# Patient Record
Sex: Female | Born: 1970 | Race: White | Hispanic: No | Marital: Married | Smoking: Current every day smoker
Health system: Southern US, Community
[De-identification: ages and names within clinical notes are randomized; demographics above are authoritative.]

---

## 2003-06-24 ENCOUNTER — Emergency Department (HOSPITAL_COMMUNITY): Admission: EM | Admit: 2003-06-24 | Discharge: 2003-06-24 | Payer: Self-pay | Admitting: Family Medicine

## 2003-08-30 ENCOUNTER — Emergency Department (HOSPITAL_COMMUNITY): Admission: EM | Admit: 2003-08-30 | Discharge: 2003-08-30 | Payer: Self-pay | Admitting: Emergency Medicine

## 2003-09-08 ENCOUNTER — Emergency Department (HOSPITAL_COMMUNITY): Admission: EM | Admit: 2003-09-08 | Discharge: 2003-09-08 | Payer: Self-pay | Admitting: Emergency Medicine

## 2004-12-08 ENCOUNTER — Inpatient Hospital Stay (HOSPITAL_COMMUNITY): Admission: AD | Admit: 2004-12-08 | Discharge: 2004-12-08 | Payer: Self-pay | Admitting: Obstetrics and Gynecology

## 2005-05-13 ENCOUNTER — Ambulatory Visit (HOSPITAL_COMMUNITY): Admission: RE | Admit: 2005-05-13 | Discharge: 2005-05-13 | Payer: Self-pay | Admitting: *Deleted

## 2005-07-20 ENCOUNTER — Ambulatory Visit (HOSPITAL_COMMUNITY): Admission: RE | Admit: 2005-07-20 | Discharge: 2005-07-20 | Payer: Self-pay | Admitting: Obstetrics and Gynecology

## 2005-09-05 ENCOUNTER — Ambulatory Visit: Payer: Self-pay | Admitting: Certified Nurse Midwife

## 2005-09-05 ENCOUNTER — Inpatient Hospital Stay (HOSPITAL_COMMUNITY): Admission: AD | Admit: 2005-09-05 | Discharge: 2005-09-05 | Payer: Self-pay | Admitting: Obstetrics & Gynecology

## 2005-09-06 ENCOUNTER — Ambulatory Visit (HOSPITAL_COMMUNITY): Admission: RE | Admit: 2005-09-06 | Discharge: 2005-09-06 | Payer: Self-pay | Admitting: Family Medicine

## 2005-09-08 ENCOUNTER — Ambulatory Visit: Payer: Self-pay | Admitting: Family Medicine

## 2005-09-21 ENCOUNTER — Ambulatory Visit (HOSPITAL_COMMUNITY): Admission: RE | Admit: 2005-09-21 | Discharge: 2005-09-21 | Payer: Self-pay | Admitting: Family Medicine

## 2005-09-22 ENCOUNTER — Ambulatory Visit: Payer: Self-pay | Admitting: Gynecology

## 2005-09-30 ENCOUNTER — Ambulatory Visit: Payer: Self-pay | Admitting: Obstetrics & Gynecology

## 2005-10-13 ENCOUNTER — Ambulatory Visit: Payer: Self-pay | Admitting: Gynecology

## 2005-10-27 ENCOUNTER — Ambulatory Visit: Payer: Self-pay | Admitting: Gynecology

## 2005-11-10 ENCOUNTER — Ambulatory Visit: Payer: Self-pay | Admitting: Gynecology

## 2005-11-14 ENCOUNTER — Ambulatory Visit: Payer: Self-pay | Admitting: Gynecology

## 2005-11-17 ENCOUNTER — Ambulatory Visit: Payer: Self-pay | Admitting: Gynecology

## 2005-11-24 ENCOUNTER — Ambulatory Visit: Payer: Self-pay | Admitting: Family Medicine

## 2005-11-24 ENCOUNTER — Ambulatory Visit: Admission: RE | Admit: 2005-11-24 | Discharge: 2005-11-24 | Payer: Self-pay | Admitting: Obstetrics and Gynecology

## 2005-11-28 ENCOUNTER — Ambulatory Visit: Payer: Self-pay | Admitting: Obstetrics & Gynecology

## 2005-12-01 ENCOUNTER — Ambulatory Visit: Payer: Self-pay | Admitting: Gynecology

## 2005-12-01 ENCOUNTER — Ambulatory Visit: Payer: Self-pay | Admitting: Obstetrics & Gynecology

## 2005-12-01 ENCOUNTER — Inpatient Hospital Stay (HOSPITAL_COMMUNITY): Admission: AD | Admit: 2005-12-01 | Discharge: 2005-12-04 | Payer: Self-pay | Admitting: Family Medicine

## 2005-12-06 ENCOUNTER — Inpatient Hospital Stay (HOSPITAL_COMMUNITY): Admission: AD | Admit: 2005-12-06 | Discharge: 2005-12-06 | Payer: Self-pay | Admitting: Obstetrics and Gynecology

## 2005-12-21 ENCOUNTER — Ambulatory Visit: Payer: Self-pay | Admitting: Obstetrics and Gynecology

## 2006-08-10 IMAGING — US US OB TRANSVAGINAL MODIFY
1 series · 18 of 28 positions shown · non-contrast
Comparison: none

CLINICAL DATA: 8 week 6 day gestational age by LMP.  Measuring large for dates. Evaluate gestational age and viability.  
 OBSTETRICAL ULTRASOUND <14 WKS AND TRANSVAGINAL OB US:
TECHNIQUE: Both transabdominal and transvaginal ultrasound examinations were performed for complete evaluation of the gestation as well as the maternal uterus, adnexal regions, and pelvic cul-de-sac.

[Series 1: us ob comp less 14 wks · 18 of 41 slices shown]
[im 1/41]
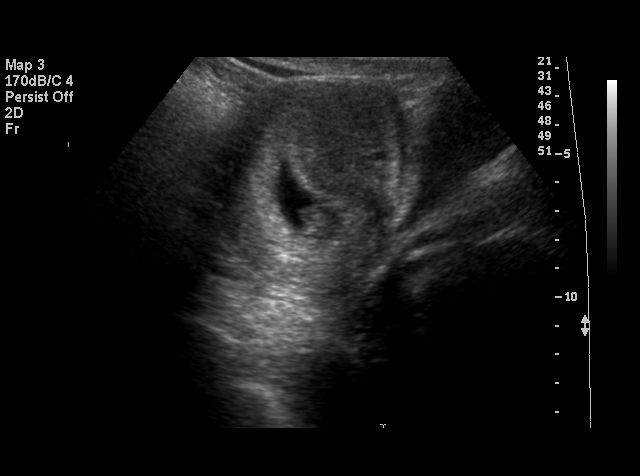
[im 3/41]
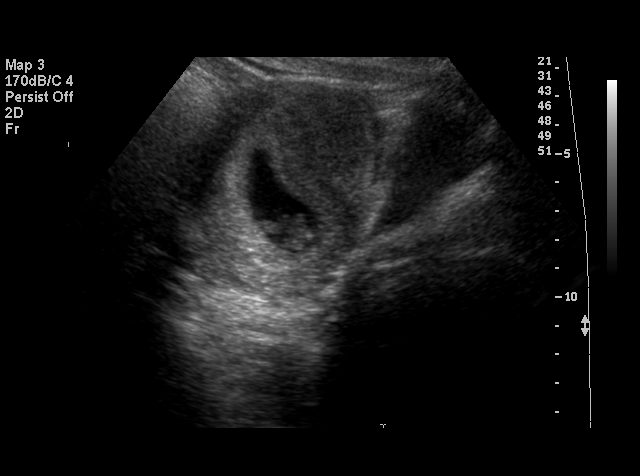
[im 5/41]
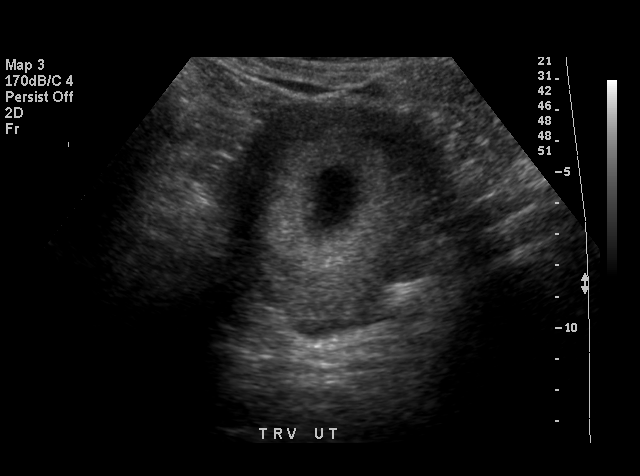
[im 8/41]
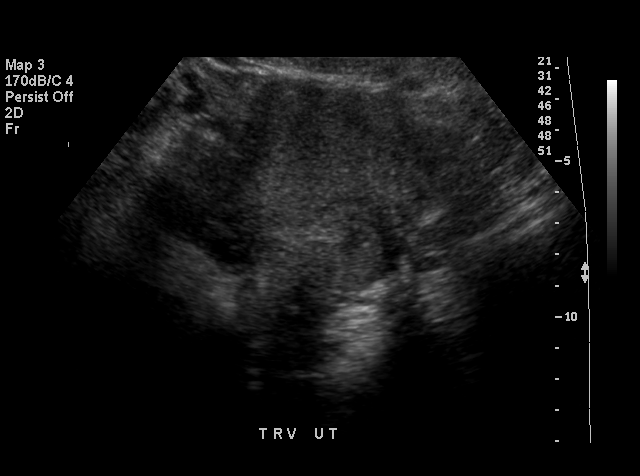
[im 11/41]
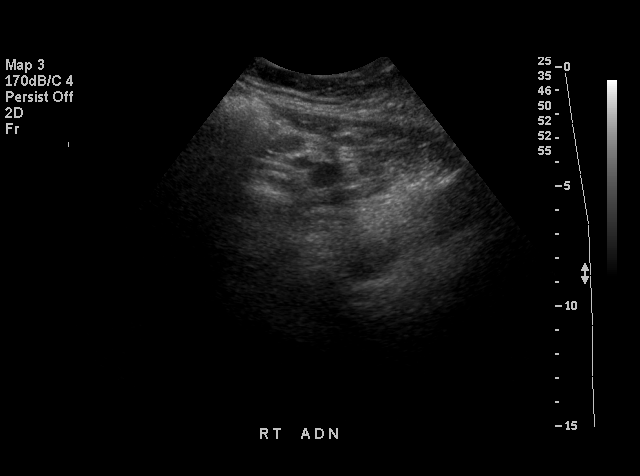
[im 12/41]
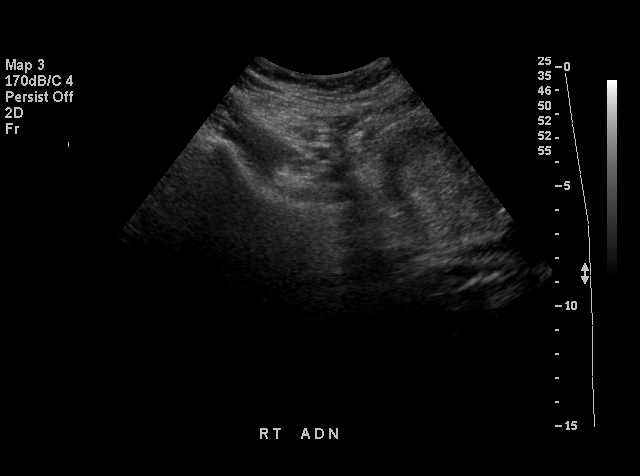
[im 15/41]
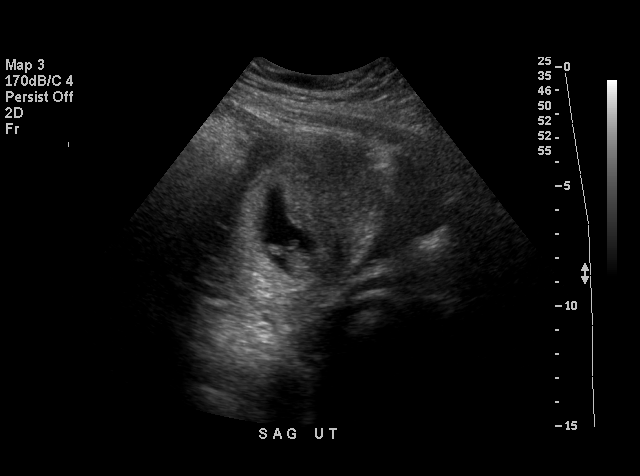
[im 17/41]
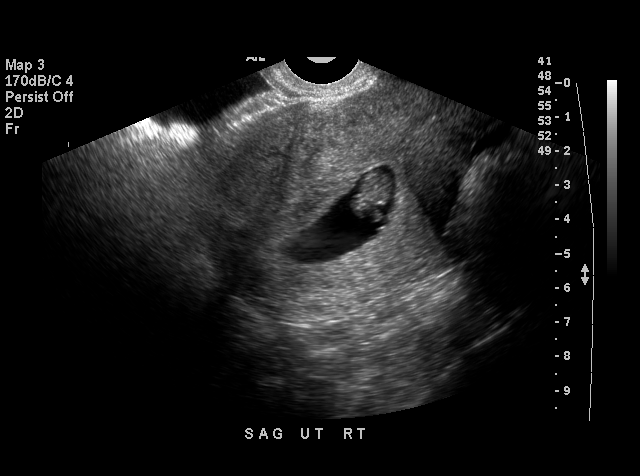
[im 20/41]
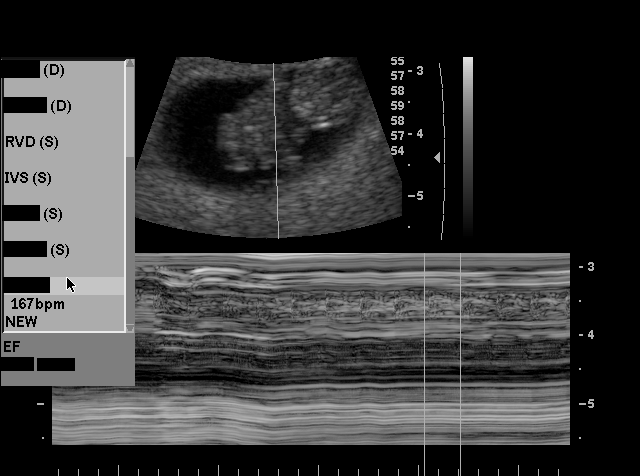
[im 21/41]
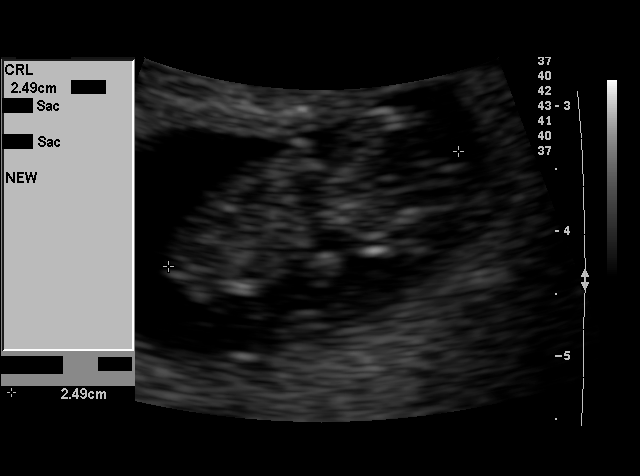
[im 24/41]
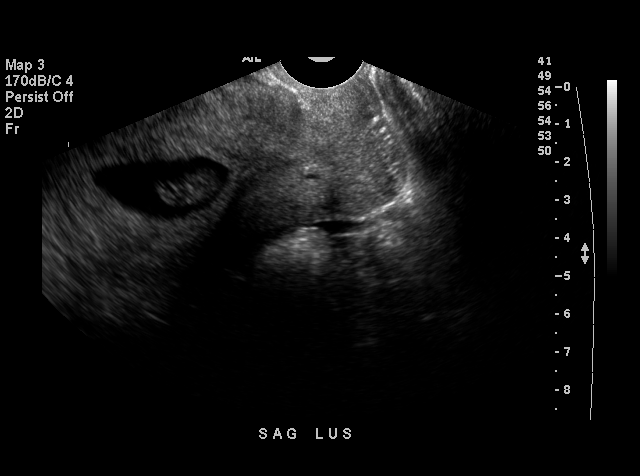
[im 26/41]
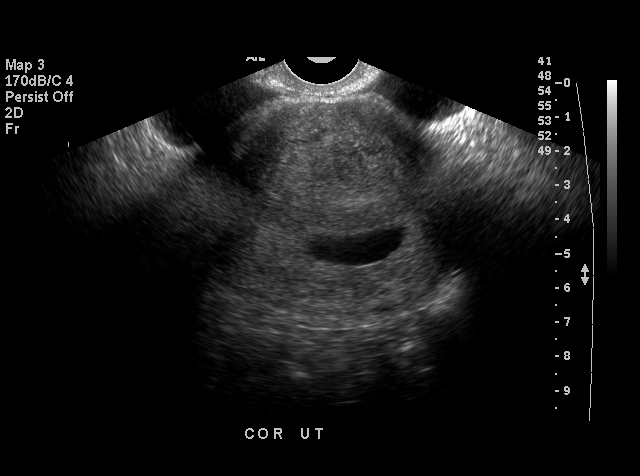
[im 29/41]
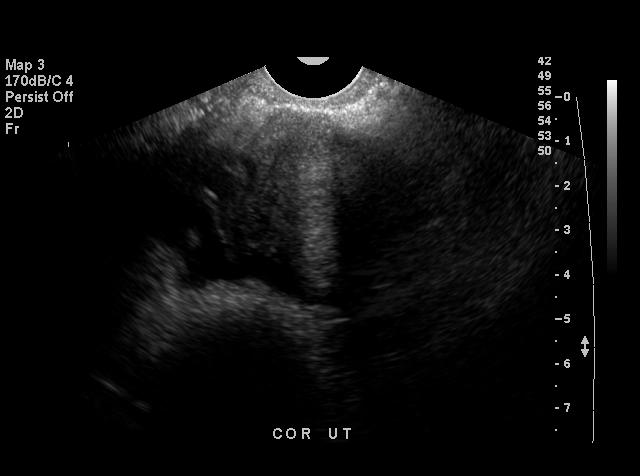
[im 32/41]
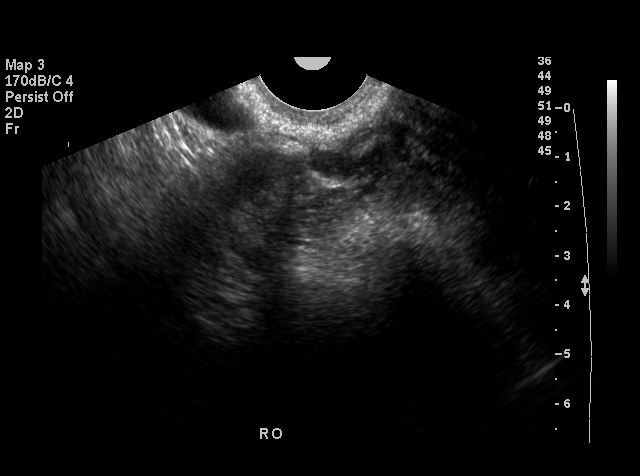
[im 33/41]
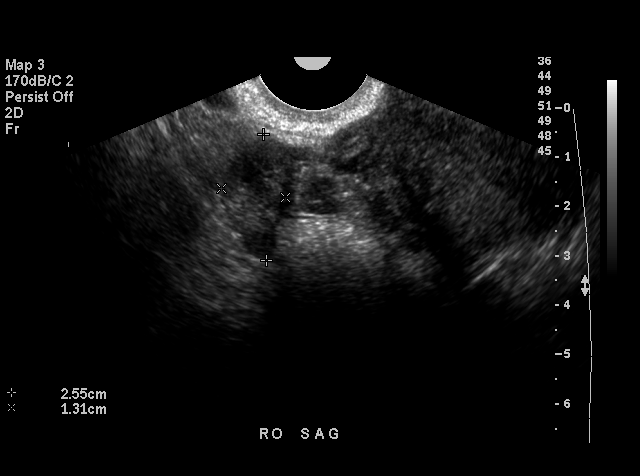
[im 36/41]
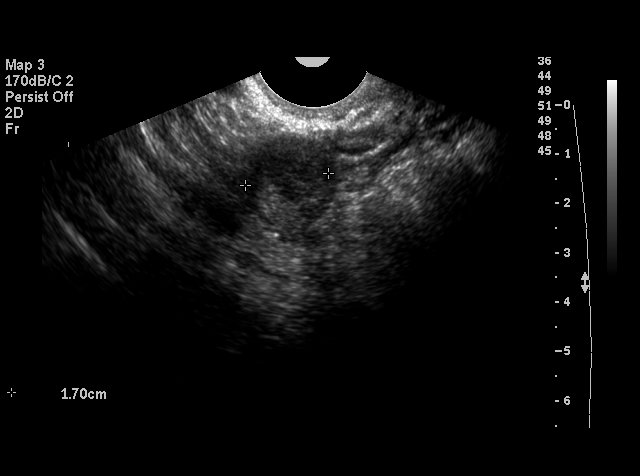
[im 38/41]
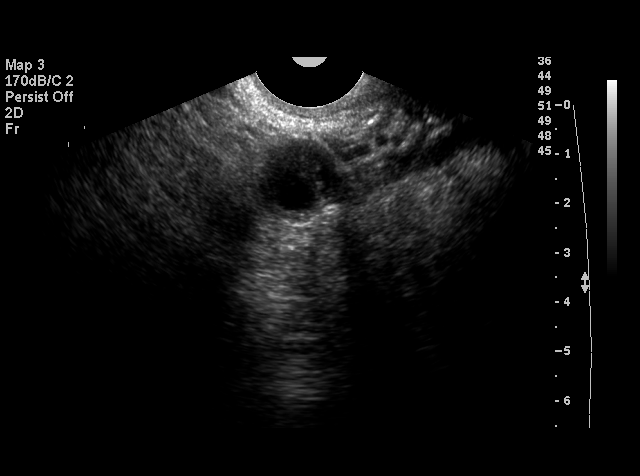
[im 41/41]
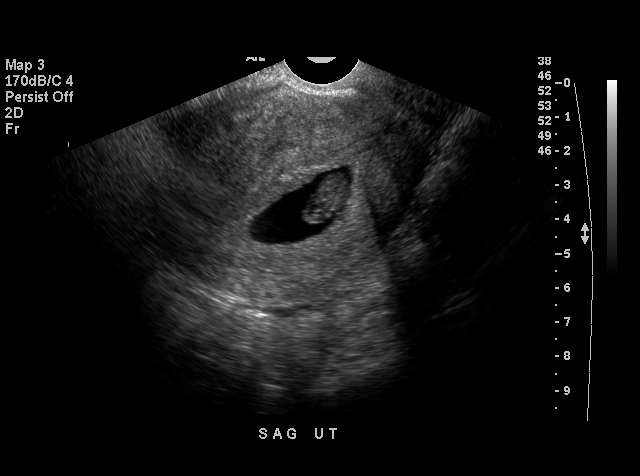

[18 of 28 positions shown; findings below may reference images not displayed]

FINDINGS: A single living intrauterine gestation is seen with measured heart rate of 167.  Embryonic crown rump length measures 2.4 cm, corresponding with a gestational age of 9 weeks 1 day.  A normal yolk sac is seen.  There is no evidence of subchorionic hemorrhage.  No fibroids or other uterine abnormalities are seen.
 Both ovaries are directly visualized on transvaginal sonography and are normal in appearance.  No adnexal masses or free fluid are identified by either transabdominal or transvaginal approach.
IMPRESSION: 1.  Single living intrauterine gestation with estimated gestational age of 9 weeks 1 day and sonographic EDC of 12/15/05.  This is concordant with LMP.
 2.  No maternal uterine or adnexal abnormality identified.

## 2006-10-17 IMAGING — US US OB COMP +14 WK
1 series · 13 of 28 positions shown · non-contrast
Comparison: 05/13/05.

CLINICAL DATA: Evaluate anatomy.  

 OBSTETRICAL ULTRASOUND:

[Series 1: us ob comp +14 wk · 0.33mm/px · 13 of 101 slices shown]
[im 4/101]
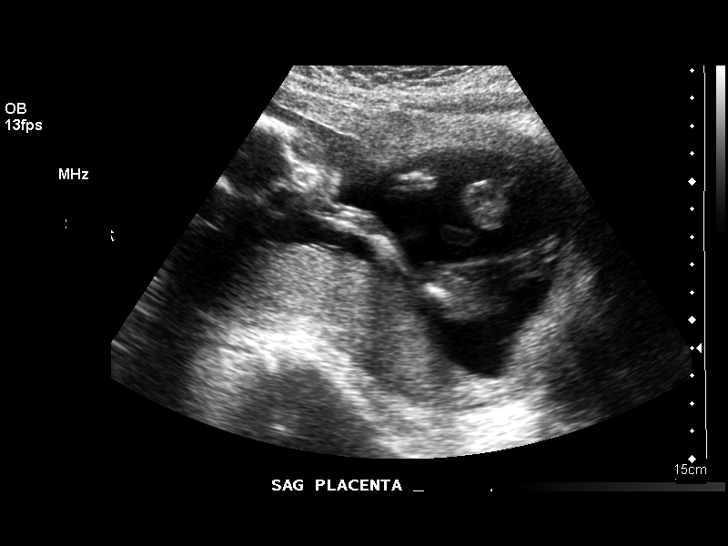
[im 12/101]
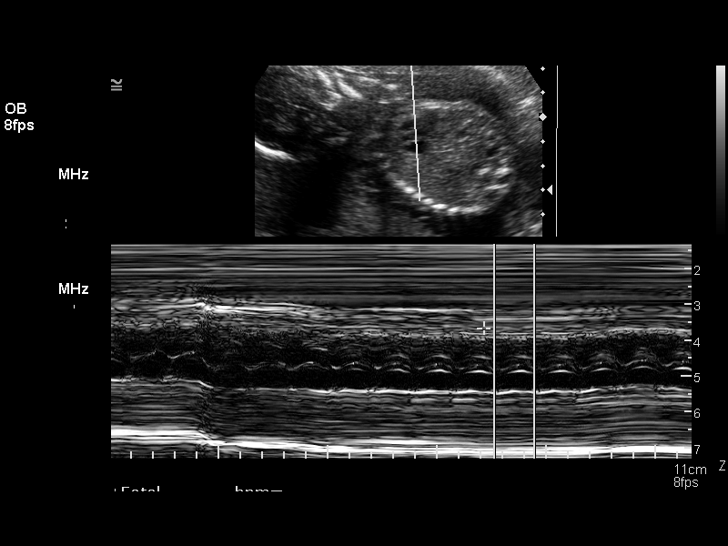
[im 19/101]
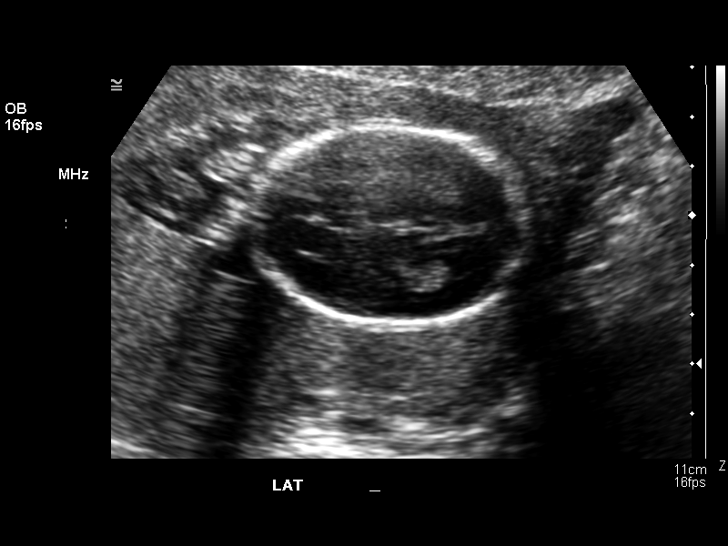
[im 26/101]
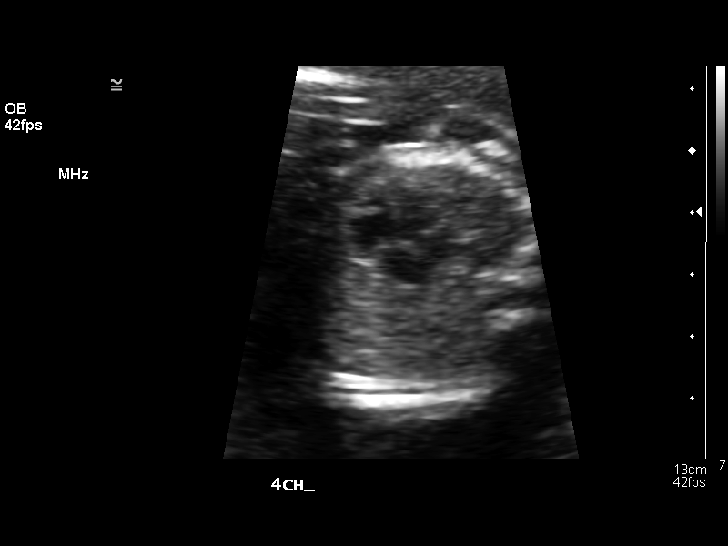
[im 34/101]
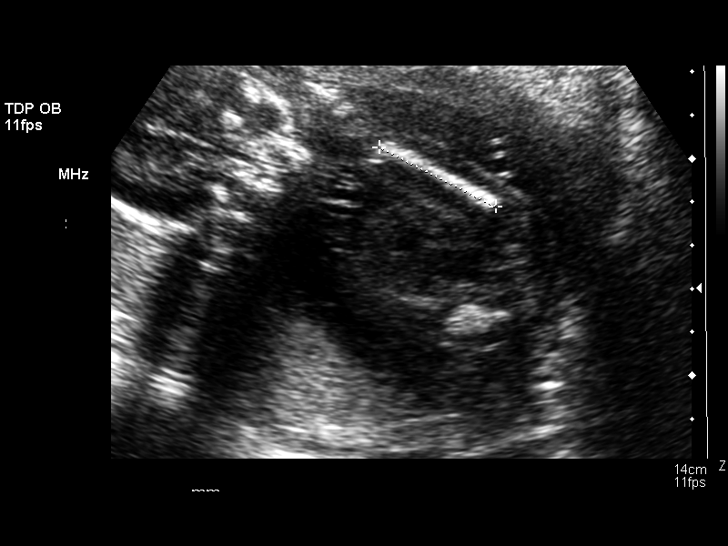
[im 41/101]
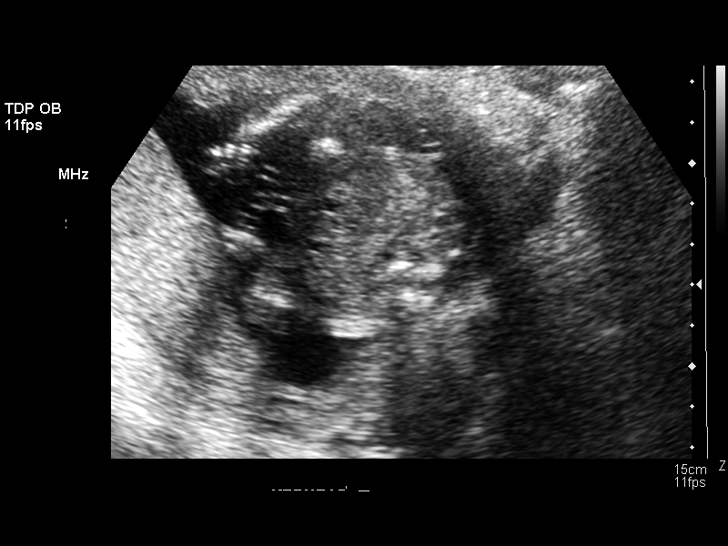
[im 52/101]
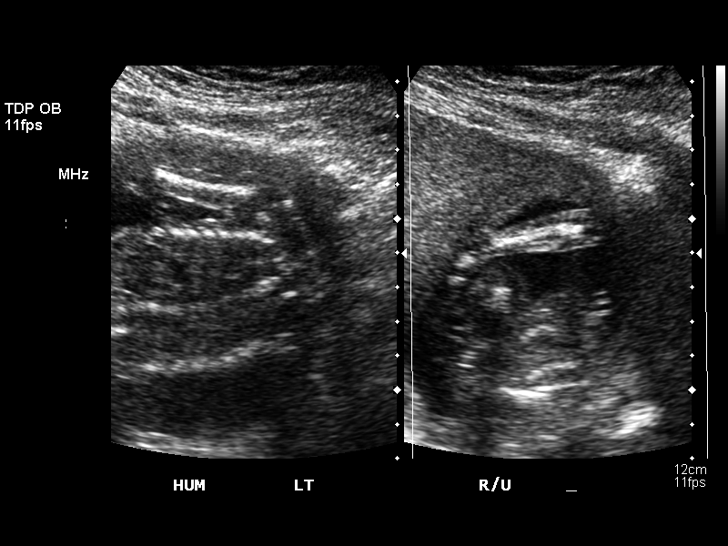
[im 60/101]
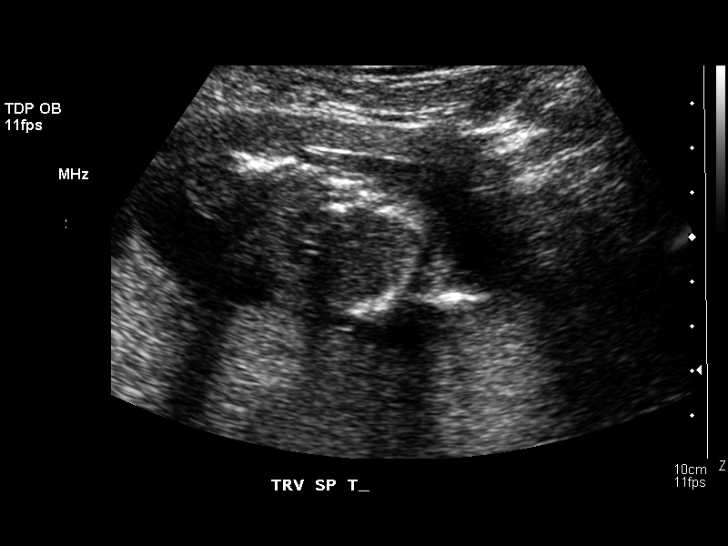
[im 67/101]
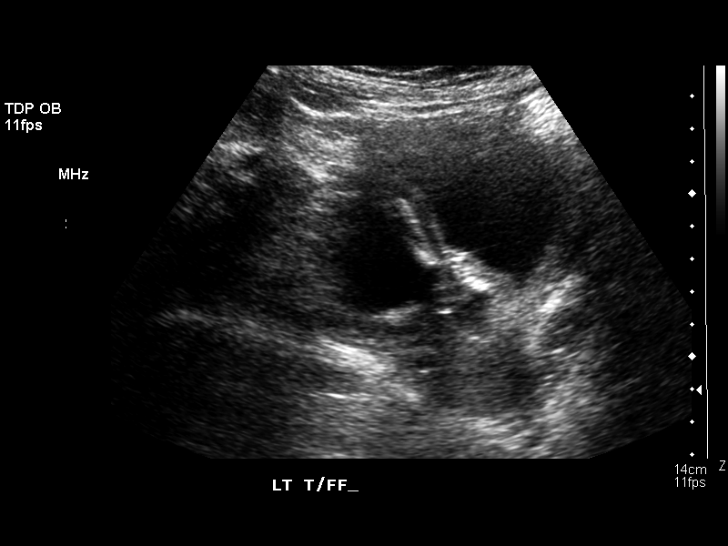
[im 75/101]
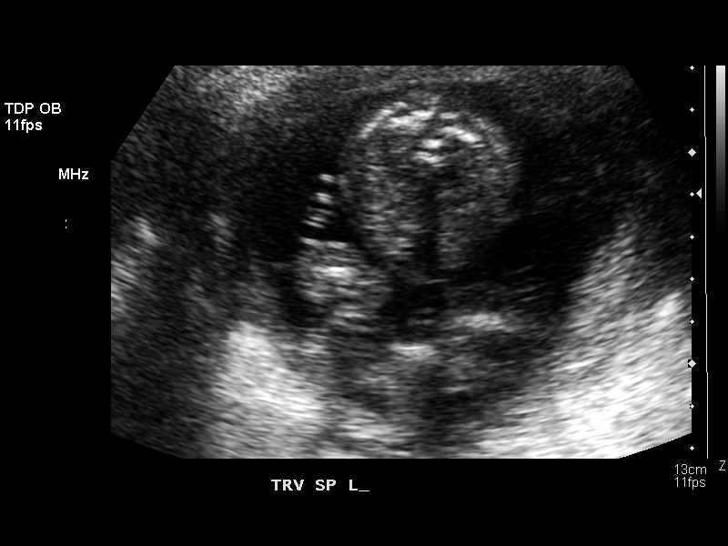
[im 82/101]
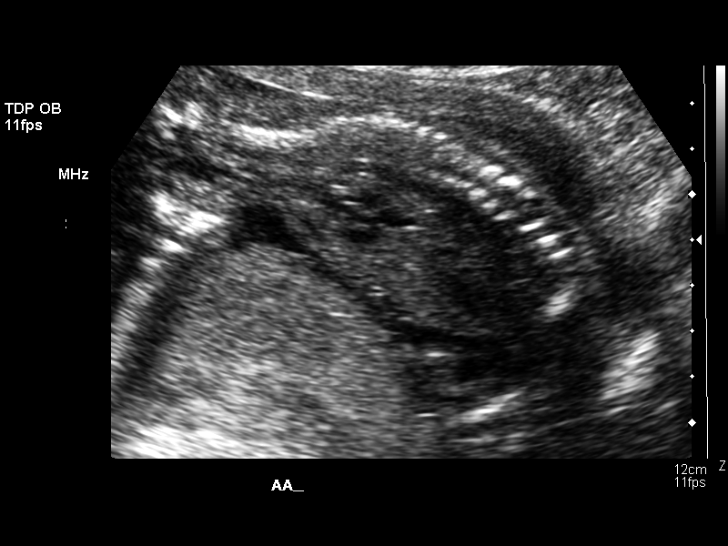
[im 89/101]
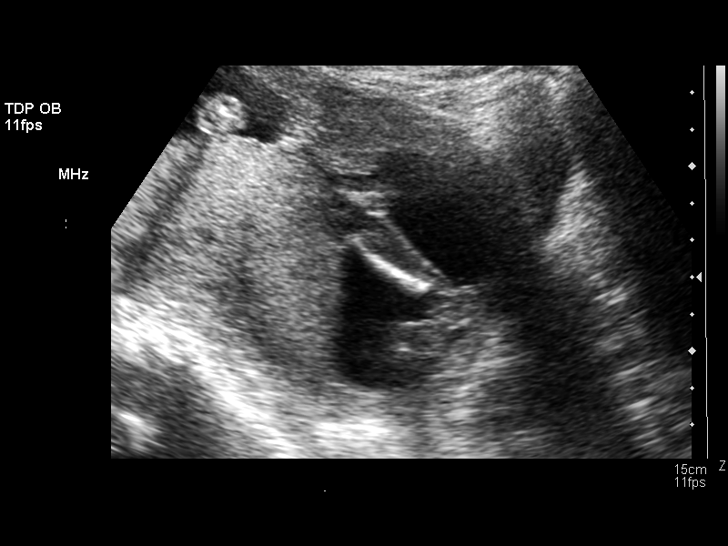
[im 97/101]
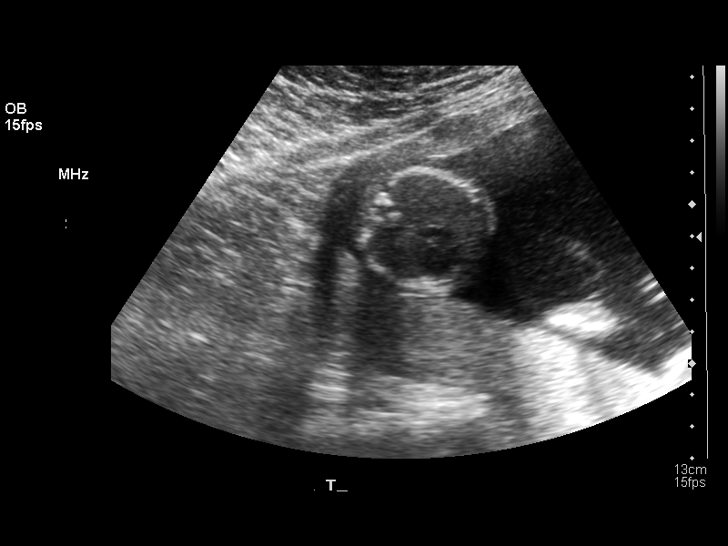

[13 of 28 positions shown; findings below may reference images not displayed]

Number of Fetuses:  1
 Heart Rate:  163
 Movement:  Yes
 Breathing:  No  
 Presentation:  Breech
 Placental Location:  Posterior 
 Grade:  I
 Previa:  No
 Amniotic Fluid (Subjective): Normal
 Amniotic Fluid (Objective):   3.7 cm Vertical pocket 

 FETAL BIOMETRY
 BPD:   3.9 cm   18 w 1 d
 HC:   15.5 cm  18 w 4 d
 AC:   14.2 cm   19 w 4 d
 FL:    2.9 cm   19 w 1 d

 MEAN GA:  18 w 6 d  US EDC:  12/15/05

 FETAL ANATOMY
 Lateral Ventricles:    Visualized 
 Thalami/CSP:      Visualized 
 Posterior Fossa:  Visualized   
 Nuchal Region:    Visualized 
 Spine:      Visualized 
 4 Chamber Heart on Left:      Not visualized 
 Stomach on Left:      Visualized   
 3 Vessel Cord:    Visualized 
 Cord Insertion site:    Visualized 
 Kidneys:  Visualized 
 Bladder:  Visualized 
 Extremities:      Visualized 

 ADDITIONAL ANATOMY VISUALIZED:  LVOT, RVOT, upper lip, orbits, diaphragm, heel, 5th digit, ductal arch, aortic arch, and female genitalia.

 MATERNAL UTERINE AND ADNEXAL FINDINGS
 Cervix: 3.0 cm Transabdominally
IMPRESSION: 1.  Single living intrauterine fetus in breech presentation with subjectively normal amniotic fluid volume.  Estimated mean gestational age by ultrasound today is   18 weeks 6 days suggesting appropriate interval growth since the [DATE].  Visualized four chamber view of the heart and profile view could not be obtained secondary to fetal position.  Although the four chamber cardiac view was not obtained, both ventricular outflow tracts and both vascular arches were visualized.

## 2006-12-04 IMAGING — US US OB FOLLOW-UP
1 series · 13 of 28 positions shown · non-contrast
Comparison: none

CLINICAL DATA: 25 week 5 day assigned gestational age by prior ultrasound.  Pregnancy induced hypertension.  Evaluate fetal growth and amniotic fluid.

[Series 1: us ob follow-up · 0.31mm/px · 13 of 30 slices shown]
[im 2/30]
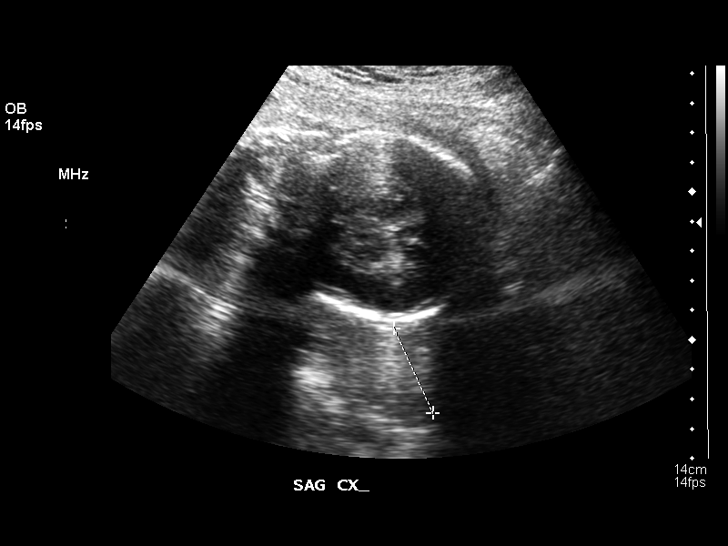
[im 4/30]
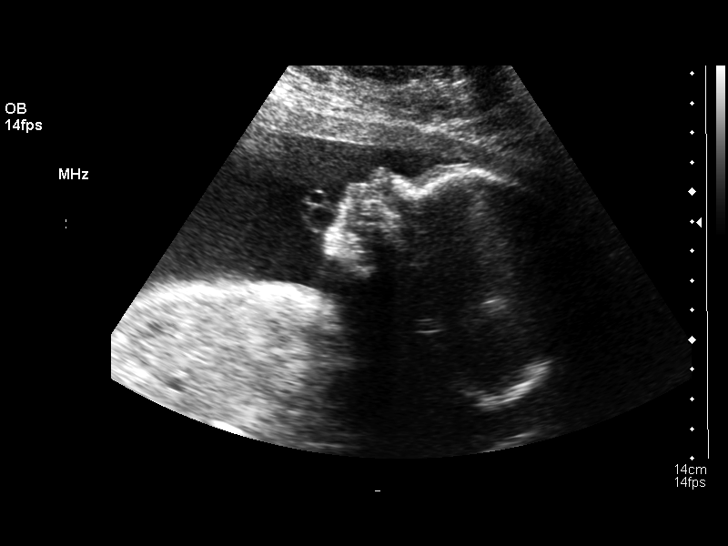
[im 6/30]
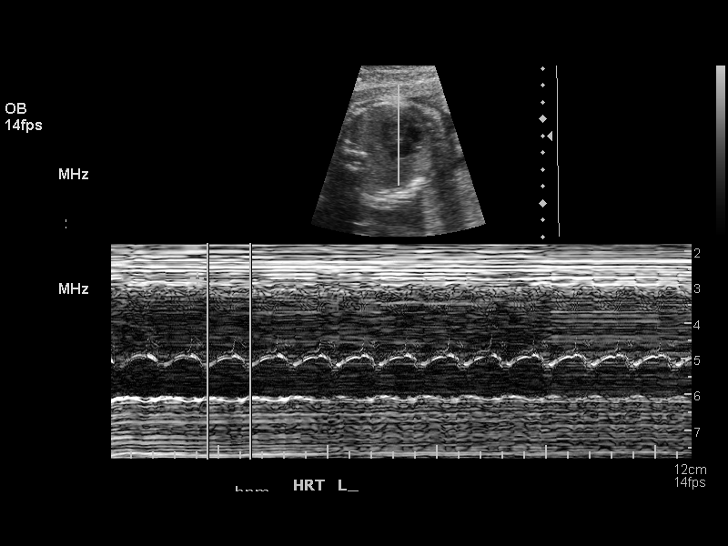
[im 8/30]
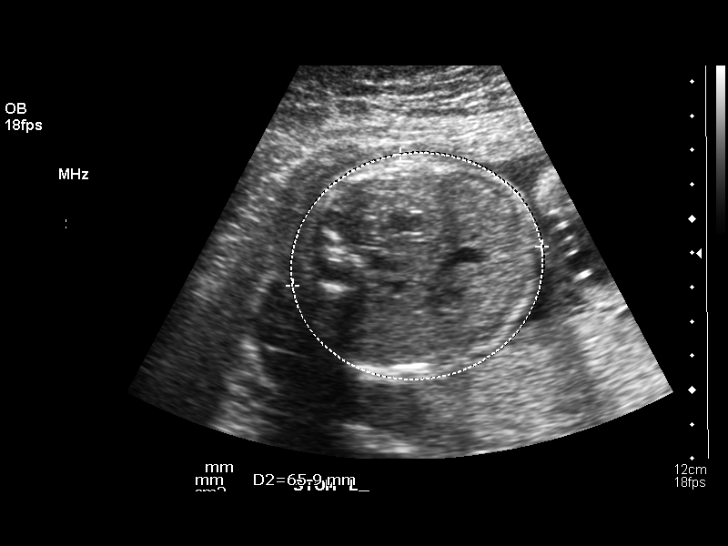
[im 10/30]
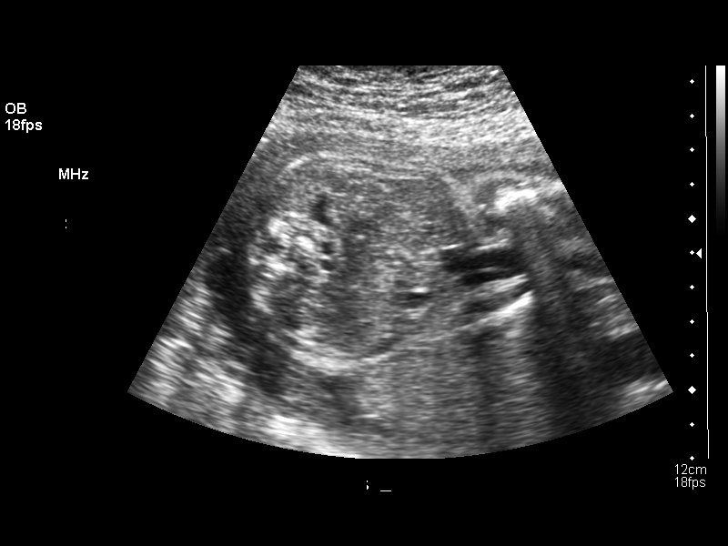
[im 12/30]
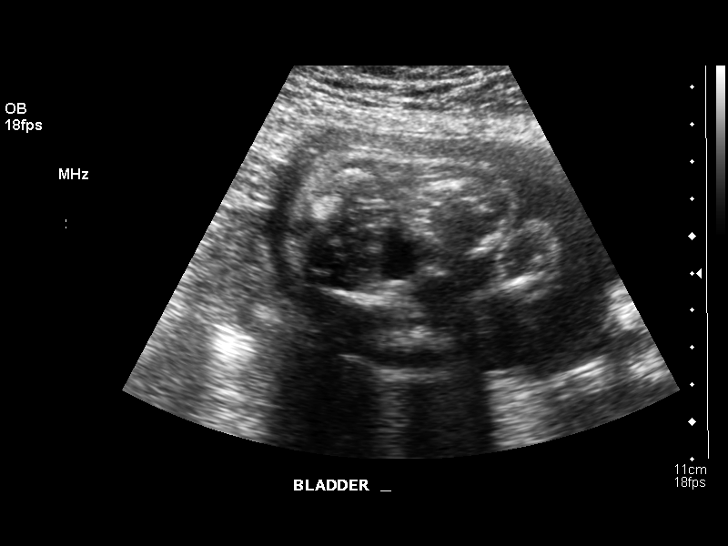
[im 16/30]
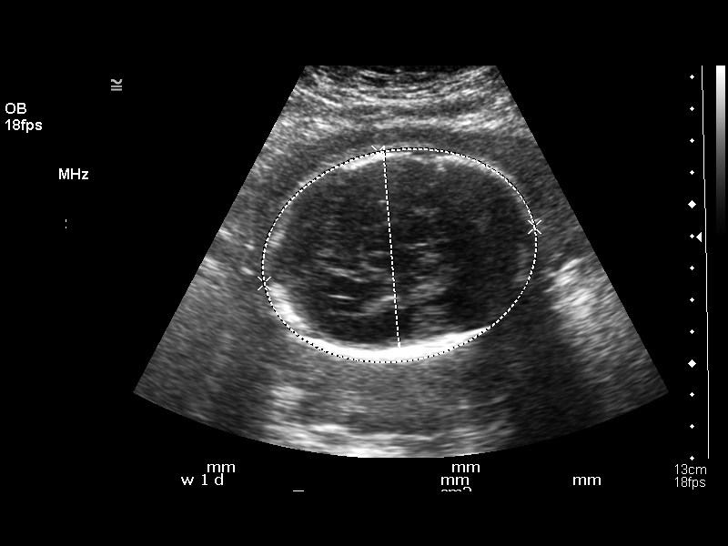
[im 18/30]
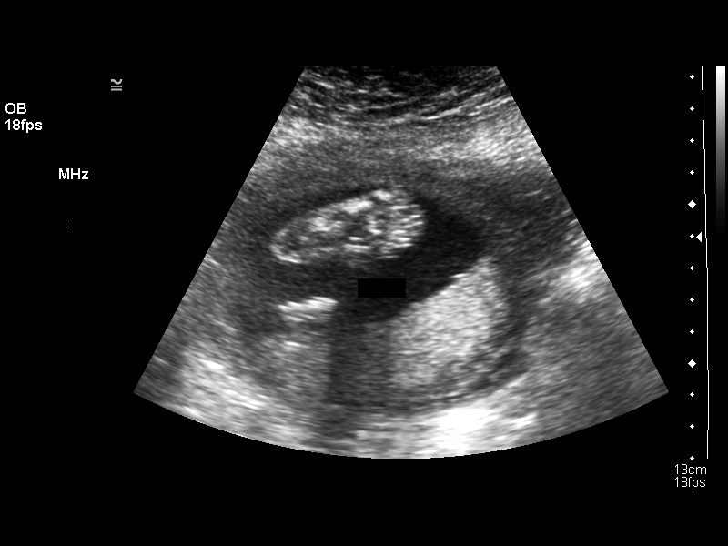
[im 20/30]
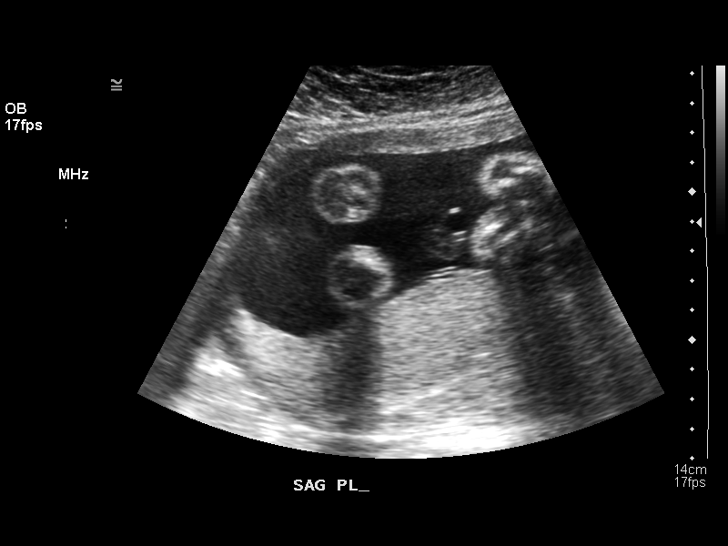
[im 22/30]
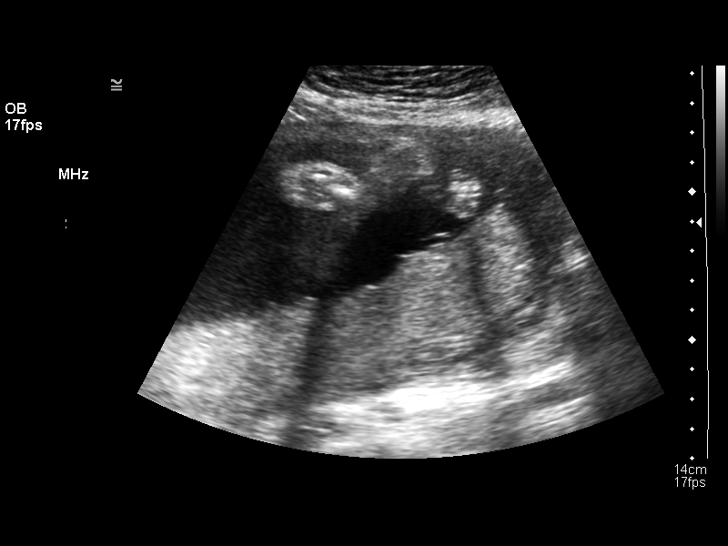
[im 24/30]
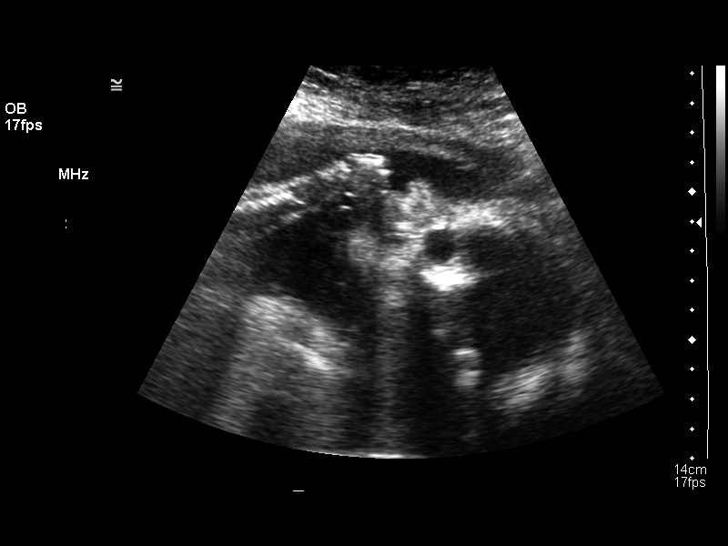
[im 26/30]
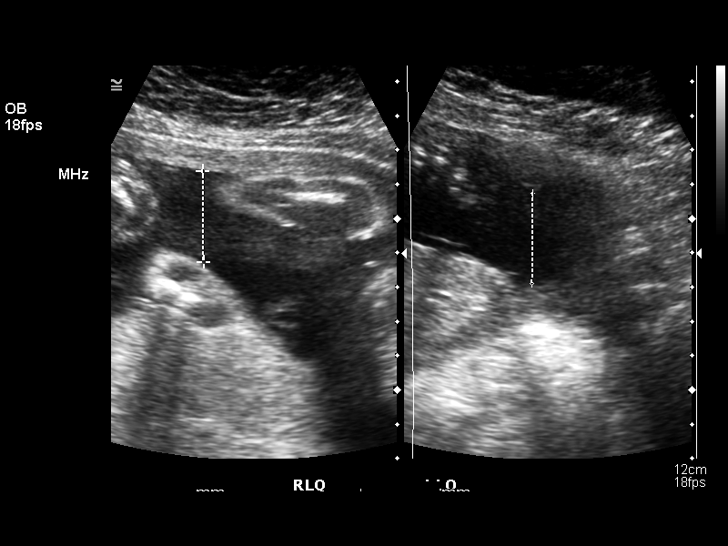
[im 28/30]
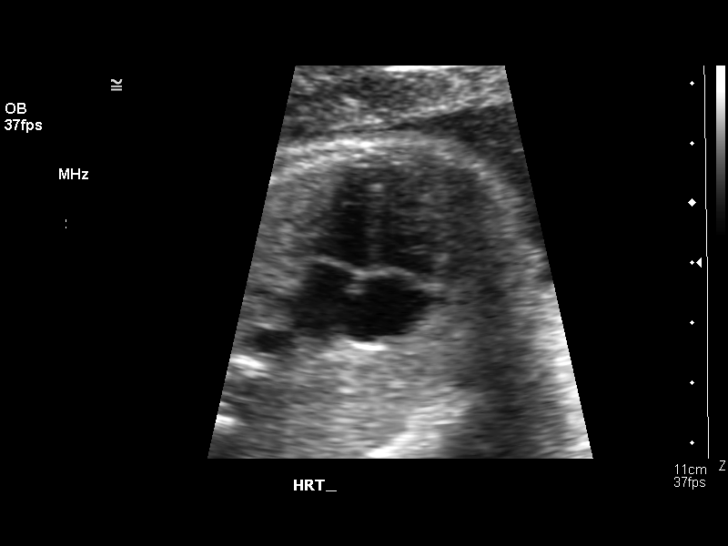

[13 of 28 positions shown; findings below may reference images not displayed]

OBSTETRICAL ULTRASOUND RE-EVALUATION:
 Number of Fetuses: 1
 Heart Rate:  153
 Movement:  Yes
 Breathing:  No
 Presentation:  Cephalic
 Placental Location:  Posterior
 Grade:  I
 Previa:  No
 Amniotic Fluid (subjective):  Normal
 Amniotic Fluid (objective): 12.9 cm AFI (5th -95th%ile = 8.9 – 22.3 cm for 26 wks)

 FETAL BIOMETRY
 BPD:  6.2 cm  25 w 0 d
 HC:  24.2 cm   26 w 2 d
 AC:  21.7 cm   26 w 2 d
 FL:   4.9 cm   26 w 4 d

 Mean GA:  26 w 0 d  US EDC:  12/13/05
 Assigned GA:  25 w 5 d  Assigned EDC:  12/15/05

 EFW:  923 g (H) 75th – 90th%ile (885 – 0003 g) For 26 wks

 FETAL ANATOMY
 Lateral Ventricles:  Visualized 
 Thalami/CSP:  Previously seen 
 Posterior Fossa:  Previously seen 
 Nuchal Region:  Previously seen 
 Spine:  Previously seen 
 4 Chamber Heart on Left:  Previously seen 
 Stomach on Left:  Visualized 
 3 Vessel Cord:  Previously seen 
 Cord Insertion Site:  Previously seen 
 Kidneys:  Visualized 
 Bladder:  Visualized 
 Extremities:  Previously seen 

 ADDITIONAL ANATOMY VISUALIZED:  Profile.

 MATERNAL UTERINE AND ADNEXAL FINDINGS
 Cervix: 3.0 cm Transabdominally
IMPRESSION: 1.  Assigned gestational age is currently 25 weeks 5 days by prior ultrasound.  Appropriate fetal growth.
 2.  Normal amniotic fluid volume.

## 2007-02-21 IMAGING — US US OB FOLLOW-UP
1 series · 13 of 28 positions shown · non-contrast
Comparison: none

CLINICAL DATA: Possible preeclampsia.  Assess growth and fetal well-being.

[Series 1: us ob follow-up · 0.37mm/px · 42 acquisitions, 13 frames shown]
[im 2/42]
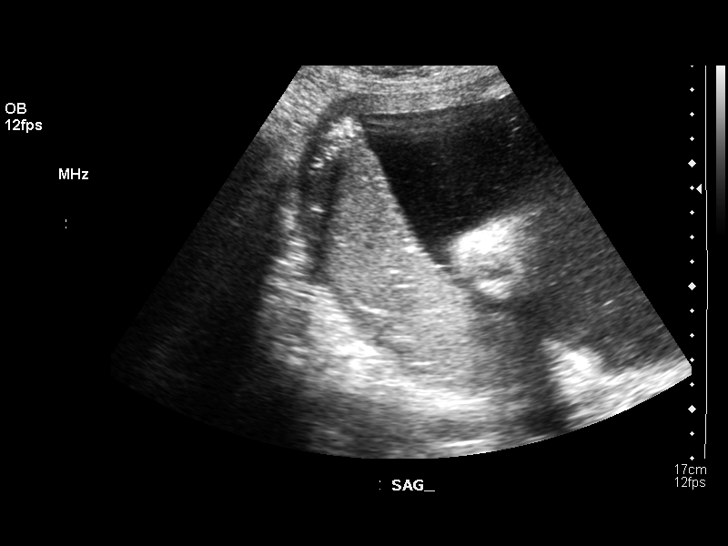
[im 5/42]
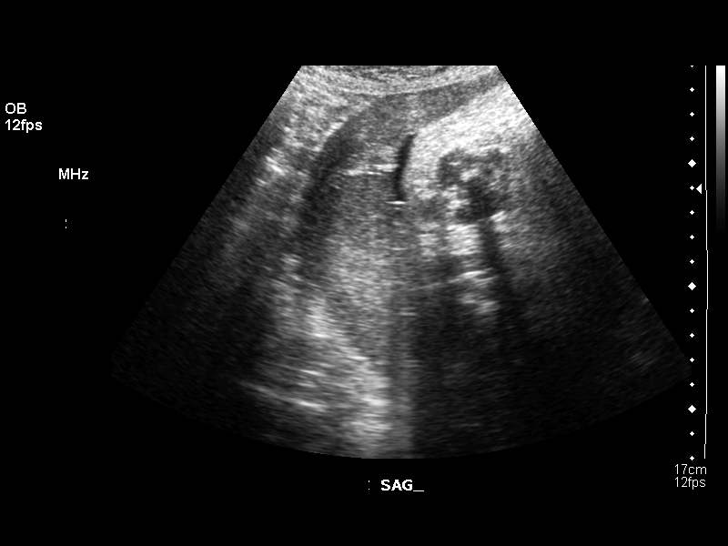
[im 8/42]
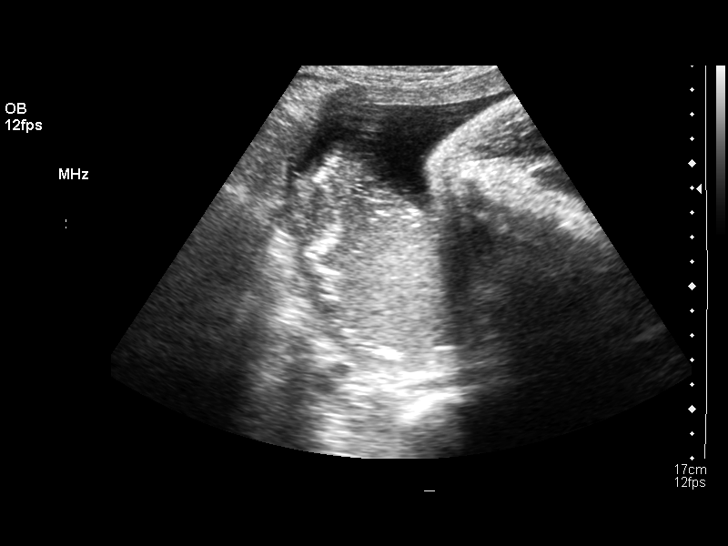
[im 11/42]
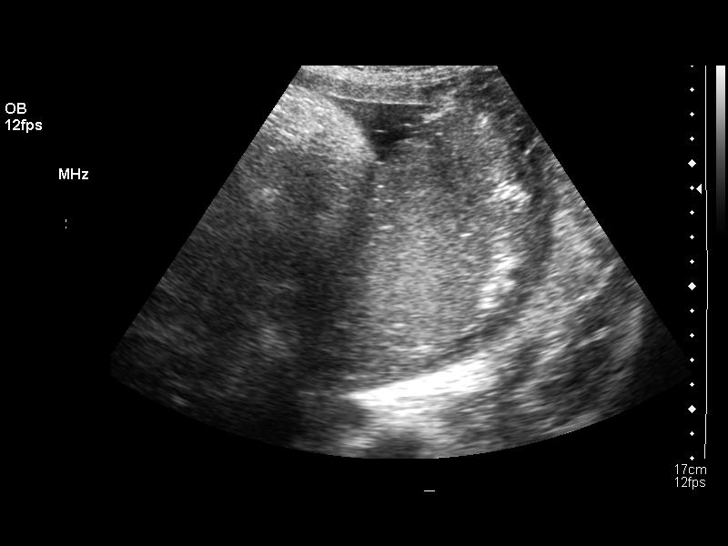
[im 14/42]
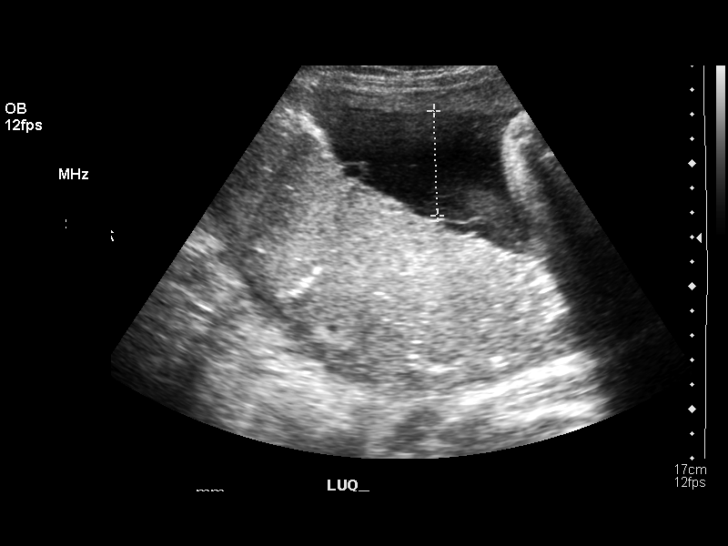
[im 17/42]
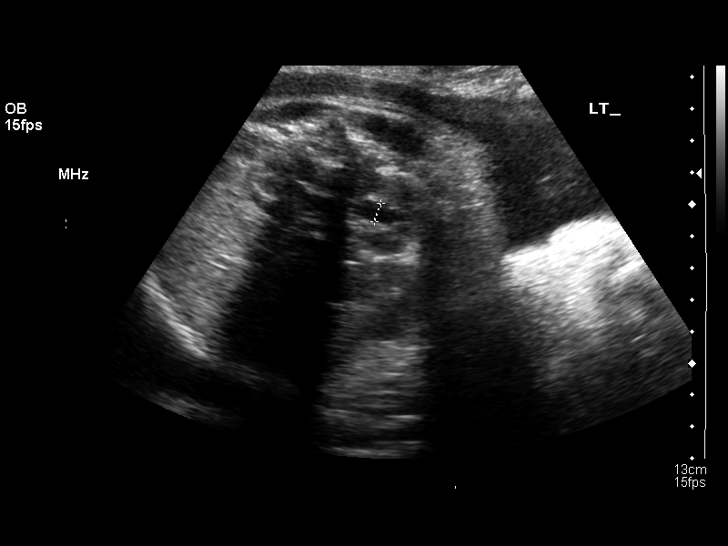
[im 22/42]
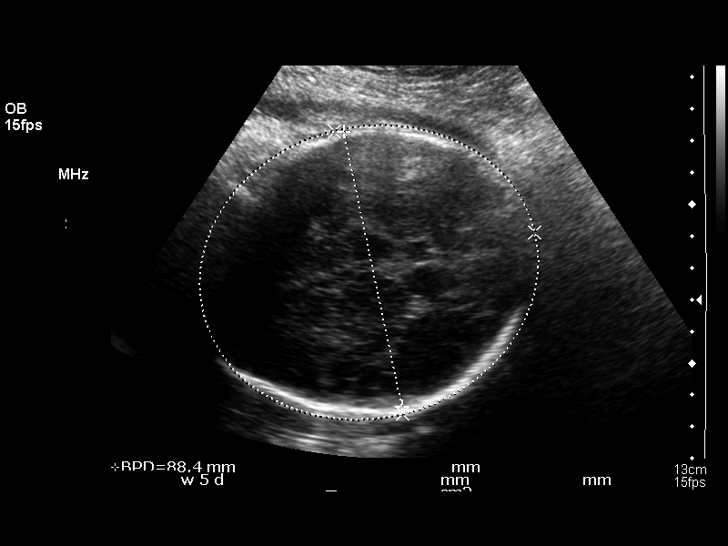
[im 25/42]
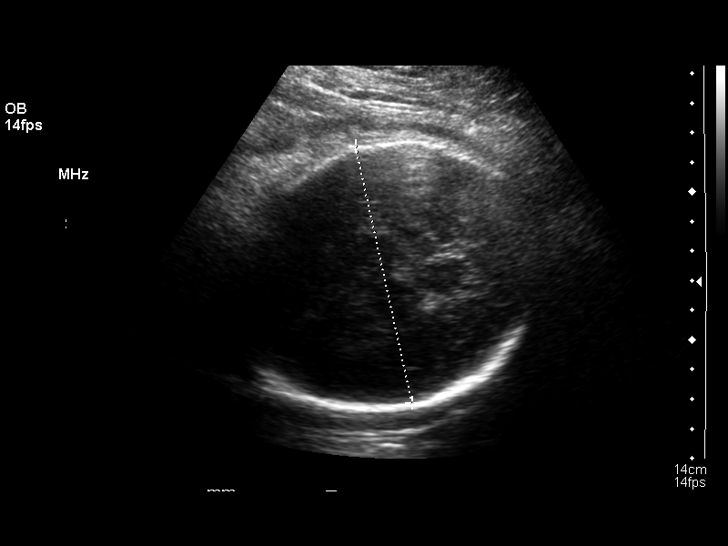
[im 28/42]
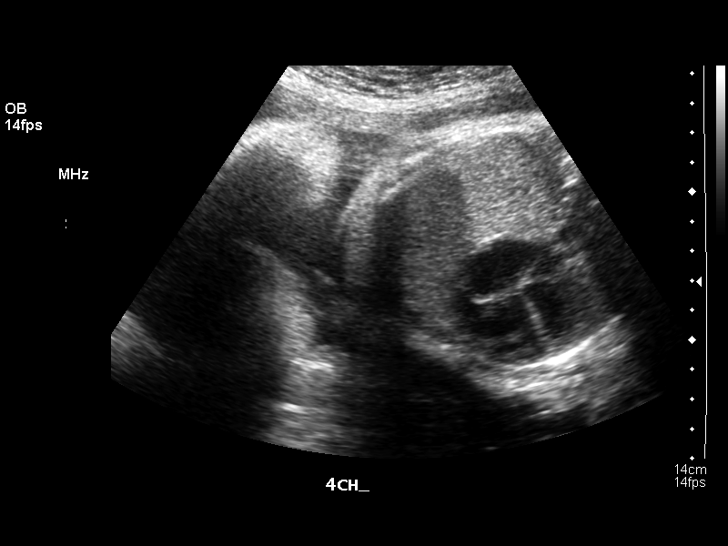
[im 31/42]
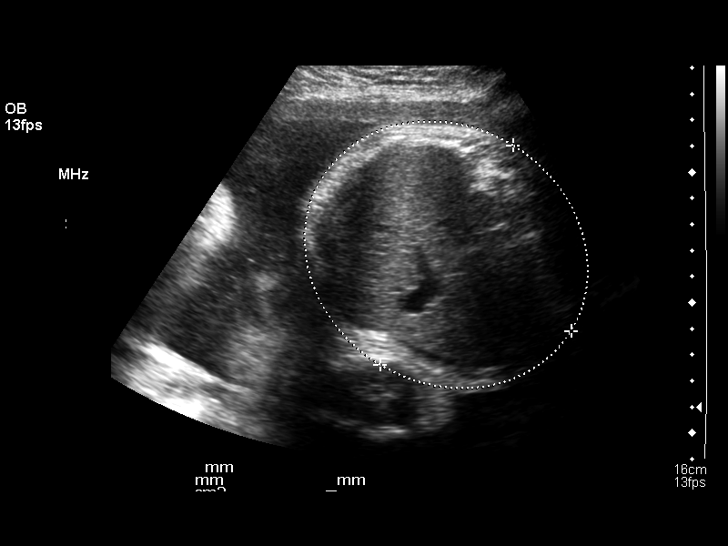
[im 34/42]
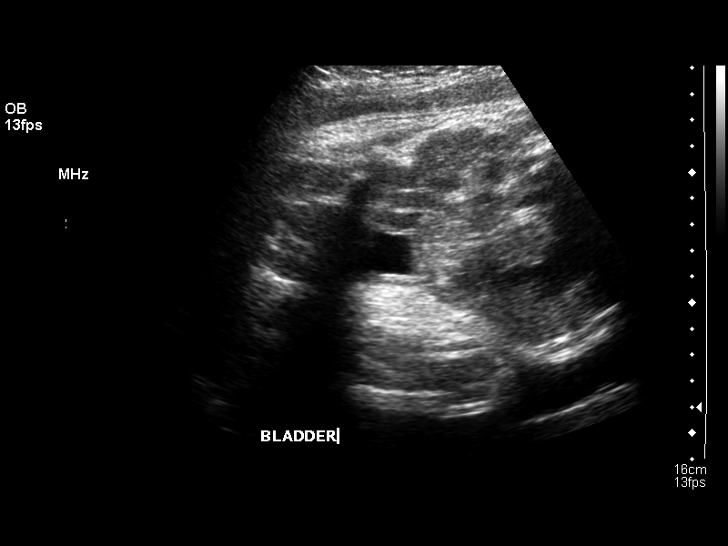
[im 37/42]
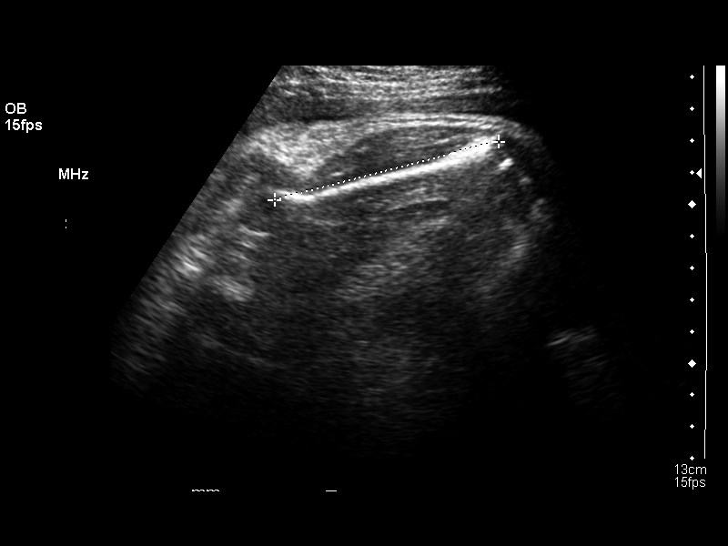
[im 40/42]
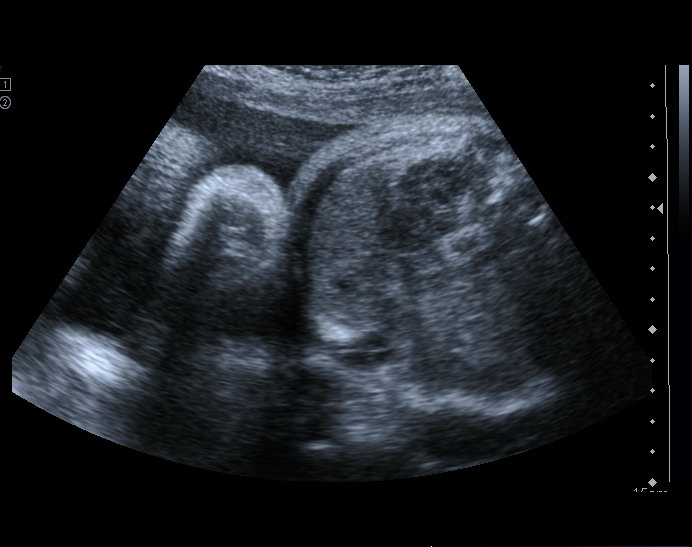

[13 of 28 positions shown; findings below may reference images not displayed]

OBSTETRICAL ULTRASOUND RE-EVALUATION:
 Number of Fetuses:  1
 Heart Rate:  160 bpm 
 Movement:  Yes
 Breathing:  Yes
 Presentation:  Cephalic
 Placental Location:  Posterior
 Grade:  II
 Previa:  No
 Amniotic Fluid (subjective):  Normal
 Amniotic Fluid (objective):  AFI 18.2 cm (6th-76th %ile = 7.5 to 24.4 cm for 37 weeks) 

 FETAL BIOMETRY
 BPD:  8.8 cm  35 w 5 d
 HC:  31.5 cm  35 w 3 d
 AC:  33.7 cm  37 w 4 d
 FL:  7.2 cm  37 w 2 d
 Fetal indices are within normal limits. 

 Mean GA:  36 w 4 d    US EDC:  12/18/05
 Assigned GA: 37 w 1 d    Assigned EDC:  12/14/05

 EFW:  6694   grams 50th - 75th %ile (5629 ? 86818 g) for 37 weeks 

 FETAL ANATOMY
 Lateral Ventricles:  Visualized 
 Thalami/CSP:  Previously visualized 
 Posterior Fossa:  Previously visualized 
 Nuchal Region:  Previously visualized 
 Spine:    Previously visualized 
 4 Chamber Heart on Left:  Visualized   
 Stomach on Left:  Visualized 
 3 Vessel Cord:  Previously visualized 
 Cord Insertion Site:  Previously visualized   
 Kidneys:    Visualized 
 Bladder:  Visualized 
 Extremities:  Previously visualized 

 BIOPHYSICAL PROFILE

 Movement:    2  Time:  25 minutes
 Breathing:  2
 Tone:  2
 Amniotic Fluid:  2

 Total Score:  8

 MATERNAL UTERINE AND ADNEXAL FINDINGS
 Cervix:  Not evaluated.
IMPRESSION: 1.  Single intrauterine pregnancy demonstrating an estimated gestational age by ultrasound of 36 weeks and 4 days.  Correlation with assigned gestational age of 37 weeks and 1 day correlates with appropriate growth.   Currently the estimated fetal weight is just below the 75th percentile for a 37 week gestation.   Previously, the estimated fetal weight was between the 75th and 90th percentile for a 28 week gestation, suggesting appropriate linear growth.
 2.   Subjectively and quantitatively normal amniotic fluid volume.
 3.   Biophysical profile score of [DATE].
 4.   No late developing fetal anatomic abnormalities are seen associated with the lateral ventricles, four chamber heart, stomach, kidneys, or bladder.  The patient was sent with a preliminary copy of today?s results to a clinic visit immediately following this exam.

## 2014-12-03 ENCOUNTER — Encounter (HOSPITAL_COMMUNITY): Payer: Self-pay | Admitting: Emergency Medicine

## 2014-12-03 ENCOUNTER — Emergency Department (INDEPENDENT_AMBULATORY_CARE_PROVIDER_SITE_OTHER)
Admission: EM | Admit: 2014-12-03 | Discharge: 2014-12-03 | Disposition: A | Discharge status: Home / Self Care | Payer: Self-pay | Source: Home / Self Care | Attending: Family Medicine | Admitting: Family Medicine

## 2014-12-03 DIAGNOSIS — Z720 Tobacco use: Secondary | ICD-10-CM

## 2014-12-03 DIAGNOSIS — K2100 Gastro-esophageal reflux disease with esophagitis, without bleeding: Secondary | ICD-10-CM

## 2014-12-03 LAB — POCT H PYLORI SCREEN: H. PYLORI SCREEN, POC: NEGATIVE

## 2014-12-03 MED ORDER — SUCRALFATE 1 GM/10ML PO SUSP: 1.0000 g | Freq: Three times a day (TID) | ORAL | Status: DC

## 2014-12-03 MED ORDER — PANTOPRAZOLE SODIUM 40 MG PO TBEC: 40.0000 mg | DELAYED_RELEASE_TABLET | Freq: Every day | ORAL | Status: DC

## 2014-12-03 NOTE — ED Provider Notes (Addendum)
CSN: [id number]     Arrival date & time 12/03/14  1750 History   First MD Initiated Contact with Patient 12/03/14 1830     Chief Complaint  Patient presents with  . Abdominal Pain   (Consider location/radiation/quality/duration/timing/severity/associated sxs/prior Treatment) HPI  Abd pain for months. Constant. Getting worse. Grinding and burning pain. Epigastric w/ radiation up into upper chest. prilosec w/ some improvement but no longer helps. Worse w/ sitting. Daily BMs.  Denies fevers, nausea, vomiting, rash, CP, SOB.  Wakes up in the morning w/ the pain.   History reviewed. No pertinent past medical history. History reviewed. No pertinent past surgical history. Family History  Problem Relation Age of Onset  . Hypertension Other    Social History  Substance Use Topics  . Smoking status: Current Every Day Smoker -- 1.00 packs/day  . Smokeless tobacco: None  . Alcohol Use: Yes     Comment: occasional   OB History    No data available     Review of Systems Per HPI with all other pertinent systems negative.   Allergies  Review of patient's allergies indicates no known allergies.  Home Medications   Prior to Admission medications   Medication Sig Start Date End Date Taking? Authorizing Provider  pantoprazole (PROTONIX) 40 MG tablet Take 1 tablet (40 mg total) by mouth daily. 12/03/14   Ozella Rocks, MD  sucralfate (CARAFATE) 1 GM/10ML suspension Take 10 mLs (1 g total) by mouth 4 (four) times daily -  with meals and at bedtime. 12/03/14   Ozella Rocks, MD   BP 159/94 mmHg  Pulse 71  Temp(Src) 98.8 F (37.1 C) (Oral)  Resp 16  SpO2 96% Physical Exam Physical Exam  Constitutional: oriented to person, place, and time. appears well-developed and well-nourished. No distress.  HENT:  Head: Normocephalic and atraumatic.  Eyes: EOMI. PERRL.  Neck: Normal range of motion.  Cardiovascular: RRR, no m/r/g, 2+ distal pulses,  Pulmonary/Chest: Effort normal and breath  sounds normal. No respiratory distress.  Abdominal: Soft. Bowel sounds are normal. NonTTP, no distension.  Musculoskeletal: Normal range of motion. Non ttp, no effusion.  Neurological: alert and oriented to person, place, and time.  Skin: Skin is warm. No rash noted. non diaphoretic.  Psychiatric: normal mood and affect. behavior is normal. Judgment and thought content normal.   ED Course  Procedures (including critical care time) Labs Review Labs Reviewed  POCT H PYLORI SCREEN    Imaging Review No results found.   MDM   1. Gastroesophageal reflux disease with esophagitis   2. Tobacco use    H. pylori negative. Failed Prilosec. Start protonic 40 mg daily as well as Carafate. Discussed at great length other symptoms which would be concerning for GI bleed, pancreatitis, appendicitis, cholecystitis. Patient to go to the emergency room if symptoms do not improve under current therapies.  Tobacco cessation counseling provided.  Ozella Rocks, MD 12/03/14 1939  Ozella Rocks, MD 12/03/14 [phone]

## 2014-12-03 NOTE — ED Notes (Signed)
C/o abd pain States pain is upper abd area States feels like indigestion Does have metallic taste in mouth  Prilosec was used as tx

## 2014-12-03 NOTE — Discharge Instructions (Signed)
Fortunately your H. pylori test was negative. Her symptoms are likely all from increased acid production your stomach causing reflux and esophagitis. Please start using the Protonix as prescribed. Please also start using Carafate for additional coating of your stomach and acid relief. If your symptoms get worse or you develop other symptoms as discussed please go to the emergency room for more thorough investigation.

## 2016-05-18 ENCOUNTER — Ambulatory Visit (HOSPITAL_COMMUNITY)
Admission: EM | Admit: 2016-05-18 | Discharge: 2016-05-18 | Disposition: A | Discharge status: Home / Self Care | Payer: Self-pay | Attending: Family Medicine | Admitting: Family Medicine

## 2016-05-18 ENCOUNTER — Encounter (HOSPITAL_COMMUNITY): Payer: Self-pay | Admitting: Emergency Medicine

## 2016-05-18 DIAGNOSIS — J111 Influenza due to unidentified influenza virus with other respiratory manifestations: Secondary | ICD-10-CM

## 2016-05-18 DIAGNOSIS — R059 Cough, unspecified: Secondary | ICD-10-CM

## 2016-05-18 DIAGNOSIS — R69 Illness, unspecified: Secondary | ICD-10-CM

## 2016-05-18 DIAGNOSIS — J208 Acute bronchitis due to other specified organisms: Secondary | ICD-10-CM

## 2016-05-18 DIAGNOSIS — R05 Cough: Secondary | ICD-10-CM

## 2016-05-18 MED ORDER — OSELTAMIVIR PHOSPHATE 75 MG PO CAPS: 75.0000 mg | ORAL_CAPSULE | Freq: Two times a day (BID) | ORAL | 0 refills | Status: DC

## 2016-05-18 MED ORDER — IPRATROPIUM BROMIDE 0.06 % NA SOLN: 2.0000 | Freq: Four times a day (QID) | NASAL | 0 refills | Status: DC

## 2016-05-18 MED ORDER — AZITHROMYCIN 250 MG PO TABS: 250.0000 mg | ORAL_TABLET | Freq: Every day | ORAL | 0 refills | Status: DC

## 2016-05-18 MED ORDER — BENZONATATE 100 MG PO CAPS: 200.0000 mg | ORAL_CAPSULE | Freq: Three times a day (TID) | ORAL | 0 refills | Status: DC | PRN

## 2016-05-18 NOTE — ED Triage Notes (Signed)
[facility] a [facility] of a cough with nasal congestion and body aches x 4 days.

## 2016-05-18 NOTE — ED Provider Notes (Signed)
CSN: [id number]     Arrival date & time 05/18/16  1426 History   First MD Initiated Contact with Patient 05/18/16 1526     Chief Complaint  Patient presents with  . Cough   (Consider location/radiation/quality/duration/timing/severity/associated sxs/prior Treatment) Patient c/o cough and nasal congestion and body aches and flu like sx's.  She is a smoker and has had a cough for over a week.   The history is provided by the patient.  Cough  Cough characteristics:  Productive Sputum characteristics:  White Severity:  Moderate Onset quality:  Sudden Duration:  5 days Timing:  Constant Progression:  Worsening Chronicity:  New Smoker: yes   Context: upper respiratory infection and weather changes   Relieved by:  Nothing Worsened by:  Nothing Ineffective treatments:  None tried   History reviewed. No pertinent past medical history. History reviewed. No pertinent surgical history. Family History  Problem Relation Age of Onset  . Hypertension Other    Social History  Substance Use Topics  . Smoking status: Current Every Day Smoker    Packs/day: 1.00  . Smokeless tobacco: Not on file  . Alcohol use Yes     Comment: occasional   OB History    No data available     Review of Systems  Constitutional: Negative.   HENT: Negative.   Eyes: Negative.   Respiratory: Positive for cough.   Cardiovascular: Negative.   Gastrointestinal: Negative.   Endocrine: Negative.   Genitourinary: Negative.   Musculoskeletal: Negative.   Allergic/Immunologic: Negative.   Neurological: Negative.   Hematological: Negative.   Psychiatric/Behavioral: Negative.     Allergies  Hydrocodone  Home Medications   Prior to Admission medications   Medication Sig Start Date End Date Taking? Authorizing Provider  azithromycin (ZITHROMAX) 250 MG tablet Take 1 tablet (250 mg total) by mouth daily. Take first 2 tablets together, then 1 every day until finished. 05/18/16   Deatra CanterWilliam J Oxford, FNP   benzonatate (TESSALON) 100 MG capsule Take 2 capsules (200 mg total) by mouth 3 (three) times daily as needed for cough. 05/18/16   Deatra CanterWilliam J Oxford, FNP  ipratropium (ATROVENT) 0.06 % nasal spray Place 2 sprays into both nostrils 4 (four) times daily. 05/18/16   Deatra CanterWilliam J Oxford, FNP  oseltamivir (TAMIFLU) 75 MG capsule Take 1 capsule (75 mg total) by mouth every 12 (twelve) hours. 05/18/16   Deatra CanterWilliam J Oxford, FNP   Meds Ordered and Administered this Visit  Medications - No data to display  BP 135/89 (BP Location: Right Arm)   Pulse 119   Temp 99.1 F (37.3 C) (Oral)   Resp 20   SpO2 97%  No data found.   Physical Exam  Constitutional: She appears well-developed and well-nourished.  HENT:  Head: Normocephalic and atraumatic.  Right Ear: External ear normal.  Left Ear: External ear normal.  Mouth/Throat: Oropharynx is clear and moist.  Eyes: Conjunctivae and EOM are normal. Pupils are equal, round, and reactive to light.  Neck: Normal range of motion. Neck supple.  Cardiovascular: Normal rate, regular rhythm and normal heart sounds.   Pulmonary/Chest: Effort normal and breath sounds normal.  Abdominal: Soft. Bowel sounds are normal.  Nursing note and vitals reviewed.   Urgent Care Course     Procedures (including critical care time)  Labs Review Labs Reviewed - No data to display  Imaging Review No results found.   Visual Acuity Review  Right Eye Distance:   Left Eye Distance:   Bilateral Distance:  Right Eye Near:   Left Eye Near:    Bilateral Near:         MDM   1. Acute bronchitis due to other specified organisms   2. Cough   3. Influenza-like illness    Tamiflu 75mg  one po bid x 5 days #10 Tessalon Perles 200mg  po tid prn  Atrovent Nasal Spray Zpak  Push po fluids, rest, tylenol and motrin otc prn as directed for fever, arthralgias, and myalgias.  Follow up prn if sx's continue or persist.    Deatra Canter, FNP 05/18/16 1548     Deatra Canter, FNP 05/18/16 1550    Deatra Canter, FNP 05/18/16 1550

## 2018-06-21 ENCOUNTER — Emergency Department (HOSPITAL_COMMUNITY)
Admission: EM | Admit: 2018-06-21 | Discharge: 2018-06-22 | Disposition: A | Discharge status: Home / Self Care | Payer: Self-pay | Attending: Emergency Medicine | Admitting: Emergency Medicine

## 2018-06-21 ENCOUNTER — Other Ambulatory Visit: Payer: Self-pay

## 2018-06-21 ENCOUNTER — Ambulatory Visit (HOSPITAL_COMMUNITY)
Admission: EM | Admit: 2018-06-21 | Discharge: 2018-06-21 | Disposition: A | Discharge status: Home / Self Care | Payer: Self-pay | Attending: Family Medicine | Admitting: Family Medicine

## 2018-06-21 ENCOUNTER — Encounter (HOSPITAL_COMMUNITY): Payer: Self-pay | Admitting: Emergency Medicine

## 2018-06-21 ENCOUNTER — Encounter (HOSPITAL_COMMUNITY): Payer: Self-pay

## 2018-06-21 ENCOUNTER — Emergency Department (HOSPITAL_COMMUNITY): Payer: Self-pay

## 2018-06-21 DIAGNOSIS — R51 Headache: Secondary | ICD-10-CM | POA: Insufficient documentation

## 2018-06-21 DIAGNOSIS — R519 Headache, unspecified: Secondary | ICD-10-CM

## 2018-06-21 DIAGNOSIS — F17210 Nicotine dependence, cigarettes, uncomplicated: Secondary | ICD-10-CM | POA: Insufficient documentation

## 2018-06-21 DIAGNOSIS — G44209 Tension-type headache, unspecified, not intractable: Secondary | ICD-10-CM

## 2018-06-21 MED ORDER — DIPHENHYDRAMINE HCL 50 MG/ML IJ SOLN
25.0000 mg | Freq: Once | INTRAMUSCULAR | Status: AC
  Administered 2018-06-22: 25 mg via INTRAVENOUS
  Filled 2018-06-21: qty 1

## 2018-06-21 MED ORDER — METOCLOPRAMIDE HCL 5 MG/ML IJ SOLN
10.0000 mg | Freq: Once | INTRAMUSCULAR | Status: AC
  Administered 2018-06-22: 10 mg via INTRAVENOUS
  Filled 2018-06-21: qty 2

## 2018-06-21 NOTE — ED Triage Notes (Signed)
Pt cc headache and neck pain its radiating and nauseous pt states this is the worst pain ever, this started 1 hour ago.

## 2018-06-21 NOTE — ED Provider Notes (Signed)
[facility] EMERGENCY DEPARTMENT Provider Note   CSN: [id number] Arrival date & time: 06/21/18  2029    History   Chief Complaint Chief Complaint  Patient presents with  . Headache  . Neck Pain    HPI Rebekah Noble is a 48 y.o. female.     The history is provided by the patient and the spouse.  Headache  Pain location:  Occipital Quality:  Sharp Radiates to: left face. Severity currently:  4/10 Severity at highest:  10/10 Onset quality:  Sudden Duration:  5 hours Timing:  Constant Progression:  Improving Chronicity:  New Relieved by:  None tried Worsened by:  Nothing Associated symptoms: nausea, neck pain and vomiting   Associated symptoms: no blurred vision, no dizziness, no eye pain, no fever, no focal weakness, no neck stiffness, no syncope and no weakness   Neck Pain  Associated symptoms: headaches   Associated symptoms: no chest pain, no fever, no syncope and no weakness   Patient presents with headache. She reports while eating dinner approximately 4 hours ago she had onset of occipital headache.  At times the pain will radiate to her left face.  She reports associated nausea vomiting.  No focal weakness.  No visual changes.  No syncope She has had similar headache several times in the past few weeks. She went to urgent care who sent her to the ER for further evaluation She has no other medical conditions.  PMH-none OB History   No obstetric history on file.      Home Medications    Prior to Admission medications   Not on File    Family History Family History  Problem Relation Age of Onset  . Hypertension Other     Social History Social History   Tobacco Use  . Smoking status: Current Every Day Smoker    Packs/day: 1.00  Substance Use Topics  . Alcohol use: Not Currently    Comment: occasional  . Drug use: No     Allergies   Hydrocodone   Review of Systems Review of Systems  Constitutional: Negative for fever.    Eyes: Negative for blurred vision and pain.  Cardiovascular: Negative for chest pain and syncope.  Gastrointestinal: Positive for nausea and vomiting.  Musculoskeletal: Positive for neck pain. Negative for neck stiffness.  Neurological: Positive for headaches. Negative for dizziness, focal weakness, syncope and weakness.  All other systems reviewed and are negative.    Physical Exam Updated Vital Signs BP (!) 160/106 (BP Location: Right Arm)   Pulse 99   Temp 98.6 F (37 C) (Oral)   Resp 16   Ht 1.626 m (5\' 4" )   Wt 76.7 kg   SpO2 94%   BMI 29.01 kg/m   Physical Exam CONSTITUTIONAL: Well developed/well nourished HEAD: Normocephalic/atraumatic EYES: EOMI/PERRL, no nystagmus, no ptosis ENMT: Mucous membranes moist NECK: supple no meningeal signs, no bruits SPINE/BACK:entire spine nontender CV: S1/S2 noted, no murmurs/rubs/gallops noted LUNGS: Lungs are clear to auscultation bilaterally, no apparent distress ABDOMEN: soft, nontender, no rebound or guarding GU:no cva tenderness NEURO:Awake/alert, face symmetric, no arm or leg drift is noted Equal 5/5 strength with shoulder abduction, elbow flex/extension, wrist flex/extension in upper extremities and equal hand grips bilaterally Equal 5/5 strength with hip flexion,knee flex/extension, foot dorsi/plantar flexion Cranial nerves 3/4/5/6/10/24/08/11/12 tested and intact Gait normal without ataxia No past pointing Sensation to light touch intact in all extremities EXTREMITIES: pulses normal, full ROM SKIN: warm, color normal PSYCH: no abnormalities of mood  noted, alert and oriented to situation    ED Treatments / Results  Labs (all labs ordered are listed, but only abnormal results are displayed) Labs Reviewed - No data to display  EKG None  Radiology Ct Head Wo Contrast  Result Date: 06/21/2018 CLINICAL DATA:  Intermittent headache EXAM: CT HEAD WITHOUT CONTRAST TECHNIQUE: Contiguous axial images were obtained from the  base of the skull through the vertex without intravenous contrast. COMPARISON:  None. FINDINGS: Brain: No evidence of acute infarction, hemorrhage, hydrocephalus, extra-axial collection or mass lesion/mass effect. Vascular: No hyperdense vessel or unexpected calcification. Skull: Normal. Negative for fracture or focal lesion. Sinuses/Orbits: No acute finding. Minimal mucosal thickening in the ethmoid sinuses Other: None IMPRESSION: Negative non contrasted CT appearance of the brain Electronically Signed   By: Jasmine Pang M.D.   On: 06/21/2018 23:39    Procedures Procedures   Medications Ordered in ED Medications  metoCLOPramide (REGLAN) injection 10 mg (10 mg Intravenous Given 06/22/18 0018)  diphenhydrAMINE (BENADRYL) injection 25 mg (25 mg Intravenous Given 06/22/18 0018)     Initial Impression / Assessment and Plan / ED Course  I have reviewed the triage vital signs and the nursing notes.  Pertinent   imaging results that were available during my care of the patient were reviewed by me and considered in my medical decision making (see chart for details).        11:33 PM Patient presents for headache.  She was seen in urgent care and sent here for further ideation.  There was a consideration for [facility].  Patient is very well-appearing, no acute distress watching TV.  She ambulates without difficulty. We will start with CT head and reassess.     CT head reviewed and is negative per radiology. CT head was performed less than 6 hours from onset of headache.  With a negative CT head within this timeframe, my suspicion for acute subarachnoid hemorrhage is low.  I do not feel further testing including lumbar puncture is warranted. Patient is awake and alert in no acute distress watching television. She is not febrile to suggest meningitis.  No signs of stroke. I feel we can safely discharge patient home with follow-up with her PCP Patient is very agreeable with this plan.  All questions were  answered with patient and her husband We discussed strict ER return precautions Final Clinical Impressions(s) / ED Diagnoses   Final diagnoses:  Acute non intractable tension-type headache    ED Discharge Orders    None       Zadie Rhine, MD 06/22/18 [phone]

## 2018-06-21 NOTE — ED Triage Notes (Signed)
Pt reports headache since last night that radiates down her neck. She reports that she took OTC medication yesterday, did not help.  Today when the headache returned she has not taken anything.  No neuro deficits. Sensitive to light and noise

## 2018-06-21 NOTE — ED Provider Notes (Signed)
[facility]   [id number] 06/21/18 Arrival Time: 1951  ASSESSMENT & PLAN:  1. Acute intractable headache, unspecified headache type    Sent to ED to r/o [facility]. Stable upon discharge.  Follow-up Information    Go to  [facility] EMERGENCY DEPARTMENT.   Specialty:  Emergency Medicine Contact information: [address] [id number] Wilhemina Bonito [place] [address] [phone]         Reviewed expectations re: course of current medical issues. Questions answered. Outlined signs and symptoms indicating need for more acute intervention. Patient verbalized understanding. After Visit Summary given.   SUBJECTIVE:  Rebekah Noble is a 48 y.o. female who presents with complaint of the worst headache of her life. Single episode last evening. Abrupt occipital headache that lasted less than one hour without neurological sequelae. Mild nausea without emesis. Approx one hour ago this evening reports 'feeling like something hit me in the back of my head' with return of 'severe' headache. Ambulatory without difficulty. No extremity sensation changes or weakness. Does report feeling a little off balance.  Is a smoker.  ROS: As in HPI. All other systems negative.  OBJECTIVE:  Vitals:   06/21/18 2010 06/21/18 2012  BP: (!) 143/91   Pulse: (!) 105   Resp: 18   Temp: 98.4 F (36.9 C)   SpO2: 98%   Weight:  76.7 kg    General appearance: alert; no distress but appears uncomfortable; holding the back of her head with her right hand Eyes: PERRLA; EOMI; conjunctiva normal HENT: normocephalic; atraumatic Neck: supple with FROM Lungs: clear to auscultation bilaterally Heart: mild tachycardia; regular rhythm Extremities: no edema; symmetrical with no gross deformities Skin: warm and dry Neurologic: CN 2-12 grossly intact; normal extremity strength and sensation; normal gait Psychological: alert and cooperative; normal mood and affect  Allergies    Allergen Reactions  . Hydrocodone Anxiety    PMH: Coughing related to smoking.  Social History   Socioeconomic History  . Marital status: Married    Spouse name: Not on file  . Number of children: Not on file  . Years of education: Not on file  . Highest education level: Not on file  Occupational History  . Not on file  Social Needs  . Financial resource strain: Not on file  . Food insecurity:    Worry: Not on file    Inability: Not on file  . Transportation needs:    Medical: Not on file    Non-medical: Not on file  Tobacco Use  . Smoking status: Current Every Day Smoker    Packs/day: 1.00  Substance and Sexual Activity  . Alcohol use: Not Currently    Comment: occasional  . Drug use: No  . Sexual activity: Not on file  Lifestyle  . Physical activity:    Days per week: Not on file    Minutes per session: Not on file  . Stress: Not on file  Relationships  . Social connections:    Talks on phone: Not on file    Gets together: Not on file    Attends religious service: Not on file    Active member of club or organization: Not on file    Attends meetings of clubs or organizations: Not on file    Relationship status: Not on file  . Intimate partner violence:    Fear of current or ex partner: Not on file    Emotionally abused: Not on file    Physically abused: Not on  file    Forced sexual activity: Not on file  Other Topics Concern  . Not on file  Social History Narrative  . Not on file   Family History  Problem Relation Age of Onset  . Hypertension Other    History reviewed. No pertinent surgical history.   Mardella Layman, MD 07/04/18 [phone]

## 2018-06-22 NOTE — Discharge Instructions (Addendum)
